# Patient Record
Sex: Male | Born: 2000 | Race: White | Hispanic: No | Marital: Single | Smoking: Never smoker
Health system: Southern US, Community
[De-identification: ages and names within clinical notes are randomized; demographics above are authoritative.]

## PROBLEM LIST (undated history)

## (undated) DIAGNOSIS — T7840XA Allergy, unspecified, initial encounter: Secondary | ICD-10-CM

## (undated) DIAGNOSIS — F909 Attention-deficit hyperactivity disorder, unspecified type: Secondary | ICD-10-CM

## (undated) DIAGNOSIS — F329 Major depressive disorder, single episode, unspecified: Secondary | ICD-10-CM

## (undated) DIAGNOSIS — F32A Depression, unspecified: Secondary | ICD-10-CM

## (undated) DIAGNOSIS — Z9109 Other allergy status, other than to drugs and biological substances: Secondary | ICD-10-CM

## (undated) DIAGNOSIS — F419 Anxiety disorder, unspecified: Secondary | ICD-10-CM

## (undated) DIAGNOSIS — K589 Irritable bowel syndrome without diarrhea: Secondary | ICD-10-CM

## (undated) DIAGNOSIS — R197 Diarrhea, unspecified: Secondary | ICD-10-CM

## (undated) HISTORY — DX: Irritable bowel syndrome, unspecified: K58.9

## (undated) HISTORY — DX: Diarrhea, unspecified: R19.7

## (undated) HISTORY — DX: Anxiety disorder, unspecified: F41.9

## (undated) HISTORY — DX: Allergy, unspecified, initial encounter: T78.40XA

## (undated) HISTORY — DX: Attention-deficit hyperactivity disorder, unspecified type: F90.9

---
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
## 2020-02-07 ENCOUNTER — Telehealth: Payer: Self-pay | Admitting: Psychiatry

## 2020-02-07 ENCOUNTER — Other Ambulatory Visit: Payer: Self-pay | Admitting: Psychiatry

## 2020-02-07 DIAGNOSIS — F902 Attention-deficit hyperactivity disorder, combined type: Secondary | ICD-10-CM

## 2020-02-07 DIAGNOSIS — G4724 Circadian rhythm sleep disorder, free running type: Secondary | ICD-10-CM

## 2020-02-07 MED ORDER — CLONIDINE HCL 0.3 MG PO TABS: ORAL_TABLET | ORAL | 0 refills | Status: DC

## 2020-02-07 NOTE — Telephone Encounter (Signed)
Dad called and LM at 4:16 that [facility] needs a refill of his clonidine.  Hasn't been seen since 08/2019.  Is now scheduled to see Almira Coaster 03/08/20 Send to [facility] on [address].Marland Kitchen

## 2020-03-08 ENCOUNTER — Encounter: Payer: Self-pay | Admitting: Adult Health

## 2020-03-08 ENCOUNTER — Telehealth: Payer: Commercial Managed Care - PPO | Admitting: Adult Health

## 2020-03-08 DIAGNOSIS — G4724 Circadian rhythm sleep disorder, free running type: Secondary | ICD-10-CM

## 2020-03-08 DIAGNOSIS — F902 Attention-deficit hyperactivity disorder, combined type: Secondary | ICD-10-CM

## 2020-03-08 DIAGNOSIS — F411 Generalized anxiety disorder: Secondary | ICD-10-CM

## 2020-03-08 DIAGNOSIS — F3342 Major depressive disorder, recurrent, in full remission: Secondary | ICD-10-CM

## 2020-03-08 NOTE — Progress Notes (Signed)
No show appointment  

## 2020-03-11 ENCOUNTER — Other Ambulatory Visit: Payer: Self-pay | Admitting: Adult Health

## 2020-03-11 ENCOUNTER — Telehealth: Payer: Self-pay | Admitting: Adult Health

## 2020-03-11 DIAGNOSIS — G4724 Circadian rhythm sleep disorder, free running type: Secondary | ICD-10-CM

## 2020-03-11 DIAGNOSIS — F902 Attention-deficit hyperactivity disorder, combined type: Secondary | ICD-10-CM

## 2020-03-11 MED ORDER — CLONIDINE HCL 0.3 MG PO TABS: ORAL_TABLET | ORAL | 0 refills | Status: DC

## 2020-03-11 NOTE — Telephone Encounter (Signed)
Next appt is 04/05/20. Requesting refill on Clonidine. Current Pharmacy is [facility] [address] [place], [place] [address]. Phone # is [phone].

## 2020-03-11 NOTE — Telephone Encounter (Signed)
Script sent  

## 2020-03-16 IMAGING — DX DG HAND COMPLETE 3+V*R*
3 series · 3 of 3 positions shown · non-contrast
Comparison: None.

CLINICAL DATA: Lacerations after punching a window.

EXAM:
RIGHT HAND - COMPLETE 3+ VIEW

[hand pa]
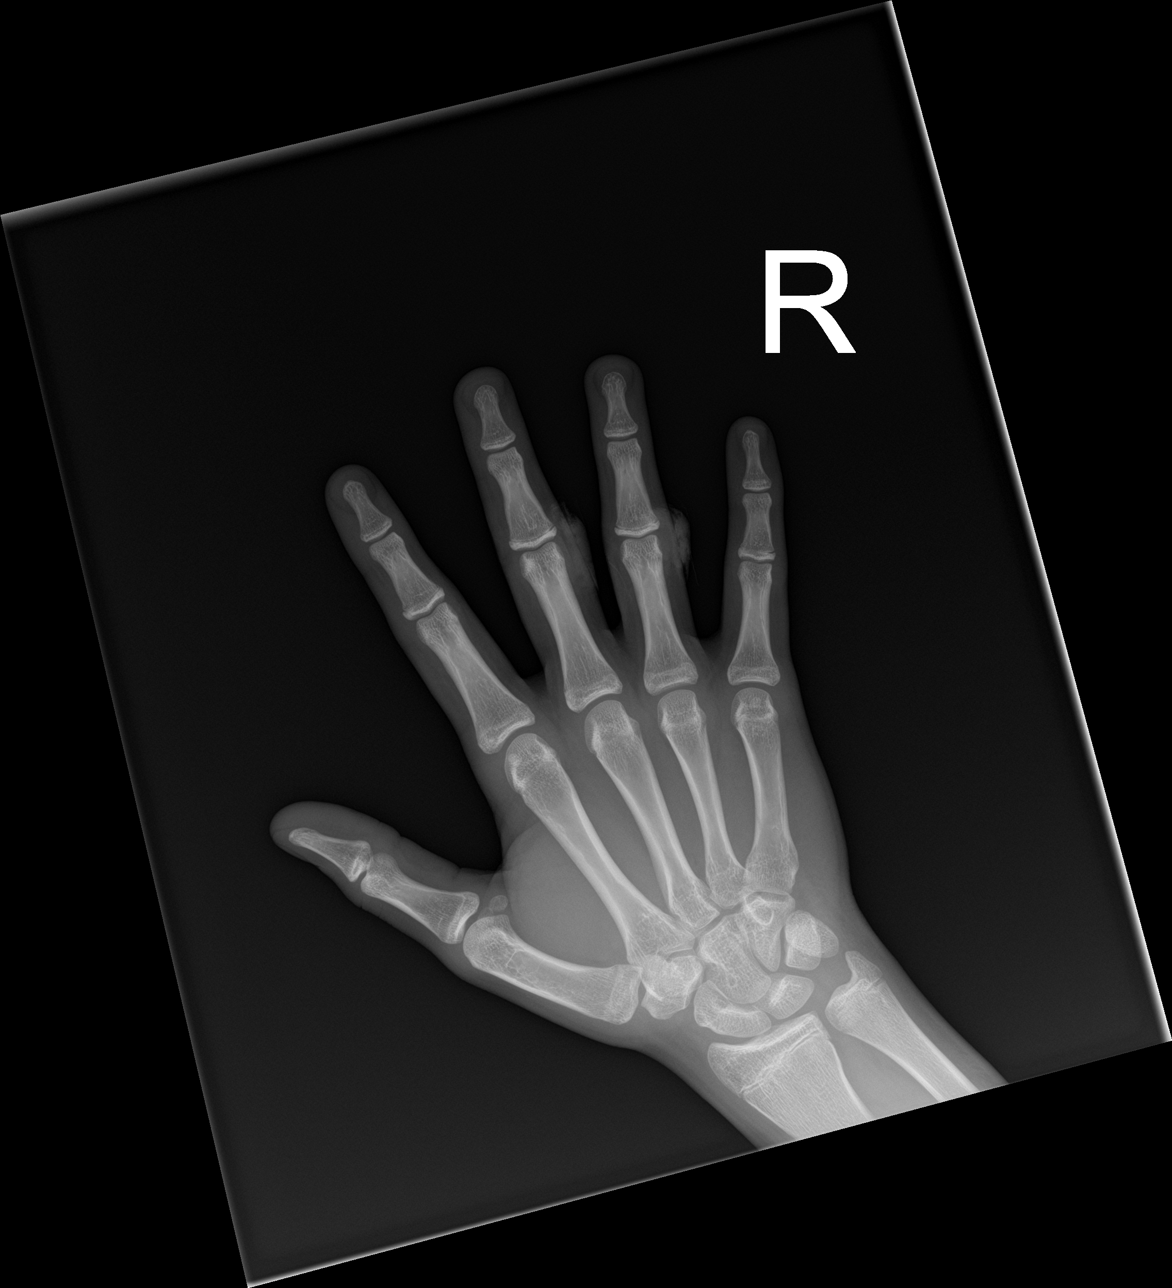

[hand obl]
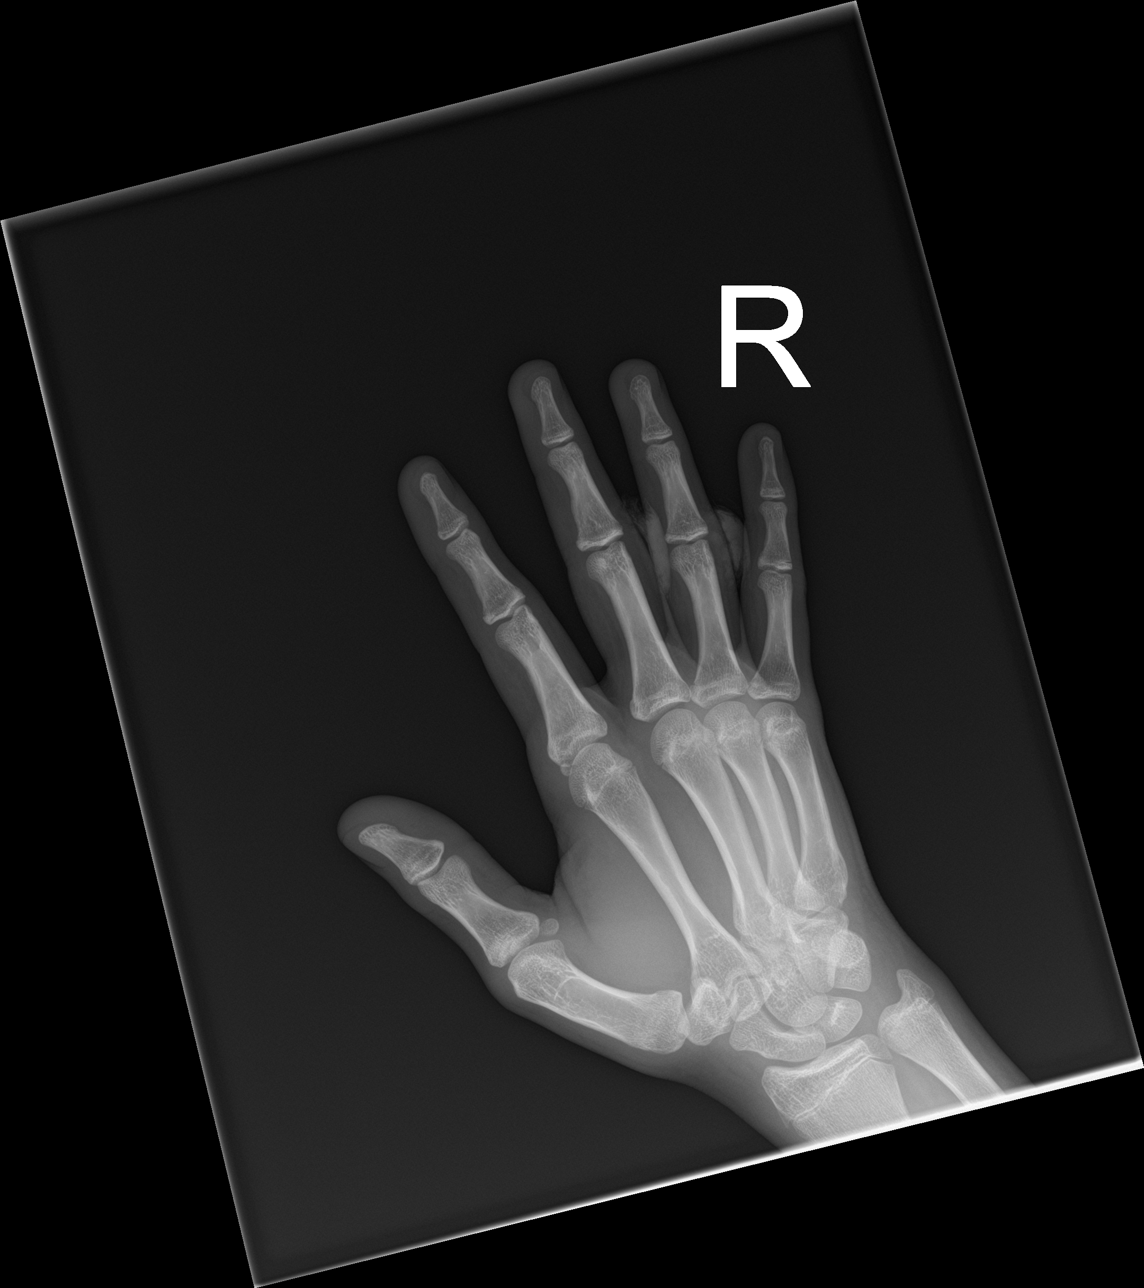

[hand lat]
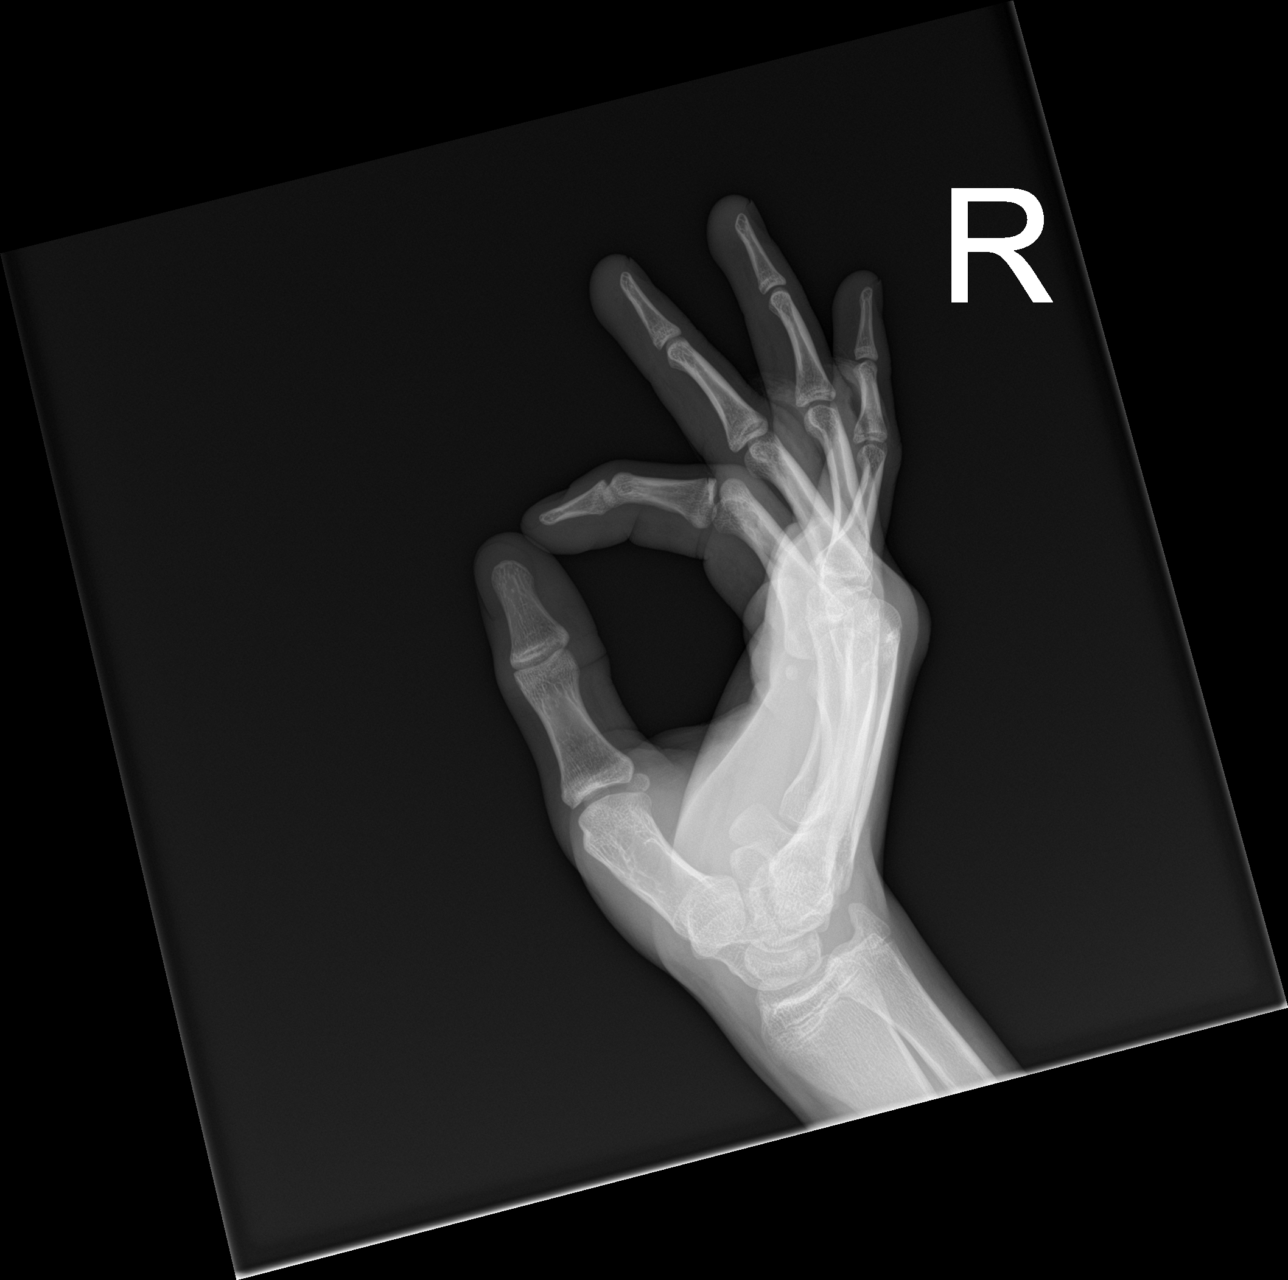

[3 of 3 positions shown; findings below may reference images not displayed]

FINDINGS: There is no fracture or dislocation or radiodense foreign body in or
on the soft tissues. Bandages around the PIP joints of the third and
fourth digits.
IMPRESSION: No acute osseous abnormality. No radiodense foreign body.

## 2020-04-05 ENCOUNTER — Telehealth (INDEPENDENT_AMBULATORY_CARE_PROVIDER_SITE_OTHER): Payer: Commercial Managed Care - PPO | Admitting: Adult Health

## 2020-04-05 ENCOUNTER — Encounter: Payer: Self-pay | Admitting: Adult Health

## 2020-04-05 DIAGNOSIS — G4724 Circadian rhythm sleep disorder, free running type: Secondary | ICD-10-CM | POA: Diagnosis not present

## 2020-04-05 DIAGNOSIS — F902 Attention-deficit hyperactivity disorder, combined type: Secondary | ICD-10-CM | POA: Diagnosis not present

## 2020-04-05 DIAGNOSIS — F411 Generalized anxiety disorder: Secondary | ICD-10-CM

## 2020-04-05 MED ORDER — DEXTROAMPHETAMINE SULFATE ER 10 MG PO CP24: 10.0000 mg | ORAL_CAPSULE | Freq: Every day | ORAL | 0 refills | Status: DC

## 2020-04-05 MED ORDER — CLONIDINE HCL 0.3 MG PO TABS
ORAL_TABLET | ORAL | 5 refills | Status: DC
Start: 2020-04-05 — End: 2020-10-03

## 2020-04-05 NOTE — Progress Notes (Deleted)
[facility]. [id number] 01-24-2001 20 y.o.  Subjective:   Patient ID:  [facility]. is a 20 y.o. (DOB 06-21-2000) male.  Chief Complaint: No chief complaint on file.   HPI [facility]. presents to the office today for follow-up of ADHD, GAD, circadian sleep disorder  Describes mood today as "ok". Pleasant. Mood symptoms - denies depression, anxiety, and irritability. Stating "I'm doing really well, the best I have in a few years". Reporting some struggles with focus and concentration. Has not tolerated Ritalin, Adderall, or Vyvanse. Would like to do a trial of Dexedrine and see if that would be helpful. Has researched side effects and hopeful he may some benefit. Concerned about increased heart rate with Adderall XR even though it was helpful. Stable interest and motivation. Taking medications as prescribed.  - Energy levels stable. Active, does not have a regular exercise routine.  Enjoys some usual interests and activities. Student. Lives with parents in [facility] [employer] - has a brother and sister. Spending time with family and friends Appetite adequate. Weight stable - 145 pounds - 5'10".  Sleeps well most nights. Averages 8 hours. Focus and concentration stable. Completing tasks. Managing aspects of household. Physicist, medical at [employer].  Denies SI or HI.  Denies AH or VH.  Previous medication trials: Vyvanse, Adderall XR   Flowsheet Row ED from 07/29/2018 in [facility] ED from 06/30/2018 in [facility]  C-SSRS RISK CATEGORY No Risk No Risk       Review of Systems:  Review of Systems  Musculoskeletal: Negative for gait problem.  Neurological: Negative for tremors.  Psychiatric/Behavioral:       Please refer to HPI    Medications: I have reviewed the patient's current medications.  Current Outpatient Medications  Medication Sig Dispense Refill  .  dextroamphetamine (DEXEDRINE SPANSULE) 10 MG 24 hr capsule Take 1 capsule (10 mg total) by mouth daily. 30 capsule 0  . cloNIDine (CATAPRES) 0.3 MG tablet TAKE 1 TABLET(0.3 MG) BY MOUTH AT BEDTIME 30 tablet 5  . mirtazapine (REMERON) 15 MG tablet Take 1 tablet (15 mg total) by mouth at bedtime. 30 tablet 3   No current facility-administered medications for this visit.    Medication Side Effects: None  Allergies:  Allergies  Allergen Reactions  . Pertussis Vaccines Other (See Comments)    Fever, dialated pupils, swelling, non-stop crying    Past Medical History:  Diagnosis Date  . ADHD (attention deficit hyperactivity disorder)   . Allergy    Seasonal  . Anxiety    Social, tests, flying, sleep  . Depression   . Diarrhea    Crampy  . Environmental allergies   . Irritable bowel syndrome    per parent report    Family History  Problem Relation Age of Onset  . Brain cancer Maternal Grandmother   . Anxiety disorder Maternal Grandmother   . Drug abuse Maternal Grandmother   . Alcohol abuse Maternal Grandmother   . Hypertension Mother   . Anxiety disorder Mother   . Depression Mother   . Brain cancer Paternal Grandmother   . Drug abuse Paternal Grandmother   . Alcohol abuse Paternal Grandmother   . Aortic stenosis Paternal Grandfather   . Alcohol abuse Paternal Grandfather   . Drug abuse Paternal Grandfather   . ADD / ADHD Father   . Drug abuse Father   . Migraines Neg Hx   . Seizures Neg Hx   .  Autism Neg Hx   . Bipolar disorder Neg Hx   . Schizophrenia Neg Hx     Social History   Socioeconomic History  . Marital status: Single    Spouse name: Not on file  . Number of children: Not on file  . Years of education: Not on file  . Highest education level: 11th grade  Occupational History  . Occupation: Student    Comment: [facility]  Tobacco Use  . Smoking status: Never Smoker  . Smokeless tobacco: Never Used  Vaping Use  . Vaping Use: Never used  Substance and  Sexual Activity  . Alcohol use: Not Currently    Alcohol/week: 0.0 standard drinks  . Drug use: Not Currently    Types: Marijuana, Benzodiazepines  . Sexual activity: Never  Other Topics Concern  . Not on file  Social History Narrative   Lives with mom, mom has recently moved out. He will be in the 12th grade when he returns to school at [employer].  He missed many initial weeks of 11th grade being at [employer] in the end of the year being panic and disruption ultimately needing [facility], though [facility] still passed him to the 12th grade with expectations for content of achievement.  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  He identifies anxiety and depression with maternal side of the family.  They consider that ADHD was evident including with hyperactivity in early elementary.  [facility] with [facility], NP and staff treated ADHD followed by testing by [facility] Dames, PhD with diagnosis of generalized anxiety disorder at [facility] then possibly others.  Diagnoses continue to expand and complicate treatment with few medications hoping in many making him worse.  [facility] nurse [facility] helped the family a great deal and refers patient to pursue care with a personal psychiatrist rather than team of ancillary staff as patient's illness anxiety continues to cooperate more diagnoses and treatment options that ultimately dilute his time rather than benefiting his skills, understanding, or symptoms.   Social Determinants of Health   Financial Resource Strain: Not on file  Food Insecurity: Not on file  Transportation Needs: Not on file  Physical Activity: Not on file  Stress: Not on file  Social Connections: Not on file  Intimate Partner Violence: Not on file    Past Medical History, Surgical history, Social history, and Family history were reviewed and updated as appropriate.   Please see review of  systems for further details on the patient's review from today.   Objective:   Physical Exam:  There were no vitals taken for this visit.  Physical Exam Constitutional:      General: He is not in acute distress. Musculoskeletal:        General: No deformity.  Neurological:     Mental Status: He is alert and oriented to person, place, and time.     Coordination: Coordination normal.  Psychiatric:        Attention and Perception: Attention and perception normal. He does not perceive auditory or visual hallucinations.        Mood and Affect: Mood normal. Mood is not anxious or depressed. Affect is not labile, blunt, angry or inappropriate.        Speech: Speech normal.        Behavior: Behavior normal.        Thought Content: Thought content normal. Thought content is not paranoid or delusional. Thought content does not  include homicidal or suicidal ideation. Thought content does not include homicidal or suicidal plan.        Cognition and Memory: Cognition and memory normal.        Judgment: Judgment normal.     Comments: Insight intact     Lab Review:     Component Value Date/Time   NA 139 06/30/2018 0043   K 3.7 06/30/2018 0043   CL 103 06/30/2018 0043   CO2 23 06/30/2018 0043   GLUCOSE 92 06/30/2018 0043   BUN 10 06/30/2018 0043   CREATININE 0.77 06/30/2018 0043   CALCIUM 9.9 06/30/2018 0043   PROT 7.0 06/30/2018 0043   ALBUMIN 4.7 06/30/2018 0043   AST 35 06/30/2018 0043   ALT 37 06/30/2018 0043   ALKPHOS 109 06/30/2018 0043   BILITOT 0.9 06/30/2018 0043   GFRNONAA NOT CALCULATED 06/30/2018 0043   GFRAA NOT CALCULATED 06/30/2018 0043       Component Value Date/Time   WBC 13.2 06/30/2018 0043   RBC 5.83 (H) 06/30/2018 0043   HGB 17.1 (H) 06/30/2018 0043   HCT 48.3 06/30/2018 0043   PLT 291 06/30/2018 0043   MCV 82.8 06/30/2018 0043   MCH 29.3 06/30/2018 0043   MCHC 35.4 06/30/2018 0043   RDW 13.4 06/30/2018 0043   LYMPHSABS 4.4 06/30/2018 0043   MONOABS  1.0 06/30/2018 0043   EOSABS 0.1 06/30/2018 0043   BASOSABS 0.0 06/30/2018 0043    No results found for: POCLITH, LITHIUM   No results found for: PHENYTOIN, PHENOBARB, VALPROATE, CBMZ   .res Assessment: Plan:    Plan:  PDMP reviewed  1. Clonidine 0.3mg   2. Add Dexidrine 10mg  daily  RTC 4 weeks  Patient advised to contact office with any questions, adverse effects, or acute worsening in signs and symptoms.  Discussed potential benefits, risks, and side effects of stimulants with patient to include increased heart rate, palpitations, insomnia, increased anxiety, increased irritability, or decreased appetite.  Instructed patient to contact office if experiencing any significant tolerability issues.  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     Please see After Visit Summary for patient specific instructions.  No future appointments.  No orders of the defined types were placed in this encounter.   -------------------------------

## 2020-04-08 NOTE — Addendum Note (Signed)
Addended by: Dorothyann Gibbs on: 04/08/2020 03:10 PM   Modules accepted: Level of Service

## 2020-04-08 NOTE — Progress Notes (Signed)
William Love [id number] October 23, 2000 20 y.o.  Virtual Visit via Telephone Note  I connected with pt on 04/08/20 at  1:00 PM EST by telephone and verified that I am speaking with the correct person using two identifiers.   I discussed the limitations, risks, security and privacy concerns of performing an evaluation and management service by telephone and the availability of in person appointments. I also discussed with the patient that there may be a patient responsible charge related to this service. The patient expressed understanding and agreed to proceed.   I discussed the assessment and treatment plan with the patient. The patient was provided an opportunity to ask questions and all were answered. The patient agreed with the plan and demonstrated an understanding of the instructions.   The patient was advised to call back or seek an in-person evaluation if the symptoms worsen or if the condition fails to improve as anticipated.  I provided 20 minutes of non-face-to-face time during this encounter.  The patient was located at home.  The provider was located at [facility].   Dorothyann Gibbs, NP   Subjective:   Patient ID:  William Love. is a 20 y.o. (DOB [birth year]) male.  Chief Complaint: No chief complaint on file.   HPI William Love. presents for follow-up of ADHD, GAD, circadian sleep disorder  Describes mood today as "ok". Pleasant. Mood symptoms - denies depression, anxiety, and irritability. Stating "I'm doing really well, the best I have in a few years". Reporting some struggles with focus and concentration. Has not tolerated Ritalin, Adderall, or Vyvanse. Would like to do a trial of Dexedrine and see if that would be helpful. Has researched side effects and hopeful he may some benefit. Concerned about increased heart rate with Adderall XR even though it was helpful. Stable interest and motivation. Taking medications as  prescribed.  - Energy levels stable. Active, does not have a regular exercise routine.  Enjoys some usual interests and activities. Student. Lives with parents in [place] - has a brother and sister. Spending time with family and friends Appetite adequate. Weight stable - 145 pounds - 5'10".  Sleeps well most nights. Averages 8 hours. Focus and concentration stable. Completing tasks. Managing aspects of household. 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  Denies AH or VH.  Previous medication trials: Vyvanse, Adderall XR      Review of Systems:  Review of Systems  Musculoskeletal: Negative for gait problem.  Neurological: Negative for tremors.  Psychiatric/Behavioral:       Please refer to HPI    Medications: I have reviewed the patient's current medications.  Current Outpatient Medications  Medication Sig Dispense Refill  . dextroamphetamine (DEXEDRINE SPANSULE) 10 MG 24 hr capsule Take 1 capsule (10 mg total) by mouth daily. 30 capsule 0  . cloNIDine (CATAPRES) 0.3 MG tablet TAKE 1 TABLET(0.3 MG) BY MOUTH AT BEDTIME 30 tablet 5  . mirtazapine (REMERON) 15 MG tablet Take 1 tablet (15 mg total) by mouth at bedtime. 30 tablet 3   No current facility-administered medications for this visit.    Medication Side Effects: Headache  Allergies:  Allergies  Allergen Reactions  . Pertussis Vaccines Other (See Comments)    Fever, dialated pupils, swelling, non-stop crying    Past Medical History:  Diagnosis Date  . ADHD (attention deficit hyperactivity disorder)   . Allergy    Seasonal  . Anxiety    Social, tests, flying, sleep  .  Depression   . Diarrhea    Crampy  . Environmental allergies   . Irritable bowel syndrome    per parent report    Family History  Problem Relation Age of Onset  . Brain cancer Maternal Grandmother   . Anxiety disorder Maternal Grandmother   . Drug abuse Maternal Grandmother   . Alcohol abuse Maternal Grandmother   .  Hypertension Mother   . Anxiety disorder Mother   . Depression Mother   . Brain cancer Paternal Grandmother   . Drug abuse Paternal Grandmother   . Alcohol abuse Paternal Grandmother   . Aortic stenosis Paternal Grandfather   . Alcohol abuse Paternal Grandfather   . Drug abuse Paternal Grandfather   . ADD / ADHD Father   . Drug abuse Father   . Migraines Neg Hx   . Seizures Neg Hx   . Autism Neg Hx   . Bipolar disorder Neg Hx   . Schizophrenia Neg Hx     Social History   Socioeconomic History  . Marital status: Single    Spouse name: Not on file  . Number of children: Not on file  . Years of education: Not on file  . Highest education level: 11th grade  Occupational History  . Occupation: Student    Comment: [employer]  Tobacco Use  . Smoking status: Never Smoker  . Smokeless tobacco: Never Used  Vaping Use  . Vaping Use: Never used  Substance and Sexual Activity  . Alcohol use: Not Currently    Alcohol/week: 0.0 standard drinks  . Drug use: Not Currently    Types: Marijuana, Benzodiazepines  . Sexual activity: Never  Other Topics Concern  . Not on file  Social History Narrative   Lives with mom, mom has recently moved out. He will be in the 12th grade when he returns to school at [employer].  He missed many initial weeks of 11th grade being at [employer] in the end of the year being panic and disruption ultimately needing [employer] [facility] [employer], though [employer] still passed him to the 12th grade with expectations for content of achievement.  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  He identifies anxiety and depression with maternal side of the family.  They consider that ADHD was evident including with hyperactivity in early elementary.  [facility] with Eula Flax, NP and staff treated ADHD followed by testing by Bryson Dames, PhD with diagnosis of generalized anxiety disorder at [facility] then possibly  others.  Diagnoses continue to expand and complicate treatment with few medications hoping in many making him worse.  Hospital nurse Vicenta Dunning helped the family a great deal and refers patient to pursue care with a personal psychiatrist rather than team of ancillary staff as patient's illness anxiety continues to cooperate more diagnoses and treatment options that ultimately dilute his time rather than benefiting his skills, understanding, or symptoms.   Social Determinants of Health   Financial Resource Strain: Not on file  Food Insecurity: Not on file  Transportation Needs: Not on file  Physical Activity: Not on file  Stress: Not on file  Social Connections: Not on file  Intimate Partner Violence: Not on file    Past Medical History, Surgical history, Social history, and Family history were reviewed and updated as appropriate.   Please see review of systems for further details on the patient's review from today.   Objective:   Physical Exam:  There were no vitals  taken for this visit.  Physical Exam Constitutional:      General: He is not in acute distress. Musculoskeletal:        General: No deformity.  Neurological:     Mental Status: He is alert and oriented to person, place, and time.     Coordination: Coordination normal.  Psychiatric:        Attention and Perception: Attention and perception normal. He does not perceive auditory or visual hallucinations.        Mood and Affect: Mood normal. Mood is not anxious or depressed. Affect is not labile, blunt, angry or inappropriate.        Speech: Speech normal.        Behavior: Behavior normal.        Thought Content: Thought content normal. Thought content is not paranoid or delusional. Thought content does not include homicidal or suicidal ideation. Thought content does not include homicidal or suicidal plan.        Cognition and Memory: Cognition and memory normal.        Judgment: Judgment normal.     Comments:  Insight intact     Lab Review:     Component Value Date/Time   NA 139 06/30/2018 0043   K 3.7 06/30/2018 0043   CL 103 06/30/2018 0043   CO2 23 06/30/2018 0043   GLUCOSE 92 06/30/2018 0043   BUN 10 06/30/2018 0043   CREATININE 0.77 06/30/2018 0043   CALCIUM 9.9 06/30/2018 0043   PROT 7.0 06/30/2018 0043   ALBUMIN 4.7 06/30/2018 0043   AST 35 06/30/2018 0043   ALT 37 06/30/2018 0043   ALKPHOS 109 06/30/2018 0043   BILITOT 0.9 06/30/2018 0043   GFRNONAA NOT CALCULATED 06/30/2018 0043   GFRAA NOT CALCULATED 06/30/2018 0043       Component Value Date/Time   WBC 13.2 06/30/2018 0043   RBC 5.83 (H) 06/30/2018 0043   HGB 17.1 (H) 06/30/2018 0043   HCT 48.3 06/30/2018 0043   PLT 291 06/30/2018 0043   MCV 82.8 06/30/2018 0043   MCH 29.3 06/30/2018 0043   MCHC 35.4 06/30/2018 0043   RDW 13.4 06/30/2018 0043   LYMPHSABS 4.4 06/30/2018 0043   MONOABS 1.0 06/30/2018 0043   EOSABS 0.1 06/30/2018 0043   BASOSABS 0.0 06/30/2018 0043    No results found for: POCLITH, LITHIUM   No results found for: PHENYTOIN, PHENOBARB, VALPROATE, CBMZ   .res Assessment: Plan:    Plan:  PDMP reviewed  1. Clonidine 0.3mg   2. Add Dexidrine 10mg  daily  RTC 4 weeks  Patient advised to contact office with any questions, adverse effects, or acute worsening in signs and symptoms.  Discussed potential benefits, risks, and side effects of stimulants with patient to include increased heart rate, palpitations, insomnia, increased anxiety, increased irritability, or decreased appetite.  Instructed patient to contact office if experiencing any significant tolerability issues.  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    Please see After Visit Summary for patient  specific instructions.  No future appointments.  No orders of the defined types were placed in this encounter.     -------------------------------

## 2020-10-03 ENCOUNTER — Encounter: Payer: Self-pay | Admitting: Adult Health

## 2020-10-03 ENCOUNTER — Telehealth (INDEPENDENT_AMBULATORY_CARE_PROVIDER_SITE_OTHER): Payer: Commercial Managed Care - PPO | Admitting: Adult Health

## 2020-10-03 DIAGNOSIS — F411 Generalized anxiety disorder: Secondary | ICD-10-CM

## 2020-10-03 DIAGNOSIS — F902 Attention-deficit hyperactivity disorder, combined type: Secondary | ICD-10-CM

## 2020-10-03 DIAGNOSIS — G4724 Circadian rhythm sleep disorder, free running type: Secondary | ICD-10-CM

## 2020-10-03 MED ORDER — DEXTROAMPHETAMINE SULFATE ER 10 MG PO CP24
10.0000 mg | ORAL_CAPSULE | Freq: Every day | ORAL | 0 refills | Status: AC
Start: 2020-10-03 — End: ?

## 2020-10-03 NOTE — Progress Notes (Signed)
William Love [id number] February 22, 2000 20 y.o.  Virtual Visit via Video Note  I connected with pt @ on 10/03/20 at 10:40 AM EDT by a video enabled telemedicine application and verified that I am speaking with the correct person using two identifiers.   I discussed the limitations of evaluation and management by telemedicine and the availability of in person appointments. The patient expressed understanding and agreed to proceed.  I discussed the assessment and treatment plan with the patient. The patient was provided an opportunity to ask questions and all were answered. The patient agreed with the plan and demonstrated an understanding of the instructions.   The patient was advised to call back or seek an in-person evaluation if the symptoms worsen or if the condition fails to improve as anticipated.  I provided 15 minutes of non-face-to-face time during this encounter.  The patient was located at home.  The provider was located at [facility].   Dorothyann Gibbs, NP   Subjective:   Patient ID:  William Wadding. is a 20 y.o. (DOB 06-Jul-2000) male.  Chief Complaint: No chief complaint on file.   HPI William Love. presents for follow-up of ADHD, GAD, circadian sleep disorder  Describes mood today as "ok". Pleasant. Mood symptoms - denies depression, anxiety, and irritability. Stating "I'm doing great". Had a productive summer. Has returned to [employer]. Lives with a roommate. Feels like the Dexedrine works well for him. Stating "I have felt much better with it". Plans to start taking every day. Stable interest and motivation. Taking medications as prescribed.   Energy levels stable. Active, does not have a regular exercise routine.  Enjoys some usual interests and activities. Student. Lives with parents in [place] - has a brother and sister. Spending time with family and friends Appetite adequate - trying to eat more.. Weight  stable - 140 - 145 pounds - 5'10".  Sleeps well most nights. Practicing better "sleep hygiene". Averages 8 hours. Focus and concentration stable. Completing tasks. Managing aspects of household. 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  Denies AH or VH.  Previous medication trials: Vyvanse, Adderall XR  Review of Systems:  Review of Systems  Musculoskeletal:  Negative for gait problem.  Neurological:  Negative for tremors.  Psychiatric/Behavioral:         Please refer to HPI   Medications: I have reviewed the patient's current medications.  Current Outpatient Medications  Medication Sig Dispense Refill   dextroamphetamine (DEXEDRINE SPANSULE) 10 MG 24 hr capsule Take 1 capsule (10 mg total) by mouth daily. 30 capsule 0   No current facility-administered medications for this visit.    Medication Side Effects: None  Allergies:  Allergies  Allergen Reactions   Pertussis Vaccines Other (See Comments)    Fever, dialated pupils, swelling, non-stop crying    Past Medical History:  Diagnosis Date   ADHD (attention deficit hyperactivity disorder)    Allergy    Seasonal   Anxiety    Social, tests, flying, sleep   Depression    Diarrhea    Crampy   Environmental allergies    Irritable bowel syndrome    per parent report    Family History  Problem Relation Age of Onset   Brain cancer Maternal Grandmother    Anxiety disorder Maternal Grandmother    Drug abuse Maternal Grandmother    Alcohol abuse Maternal Grandmother    Hypertension Mother    Anxiety disorder Mother  Depression Mother    Brain cancer Paternal Grandmother    Drug abuse Paternal Grandmother    Alcohol abuse Paternal Grandmother    Aortic stenosis Paternal Grandfather    Alcohol abuse Paternal Grandfather    Drug abuse Paternal Grandfather    ADD / ADHD Father    Drug abuse Father    Migraines Neg Hx    Seizures Neg Hx    Autism Neg Hx    Bipolar disorder Neg Hx    Schizophrenia Neg  Hx     Social History   Socioeconomic History   Marital status: Single    Spouse name: Not on file   Number of children: Not on file   Years of education: Not on file   Highest education level: 11th grade  Occupational History   Occupation: Consulting civil engineer    Comment: [employer]  Tobacco Use   Smoking status: Never   Smokeless tobacco: Never  Vaping Use   Vaping Use: Never used  Substance and Sexual Activity   Alcohol use: Not Currently    Alcohol/week: 0.0 standard drinks   Drug use: Not Currently    Types: Marijuana, Benzodiazepines   Sexual activity: Never  Other Topics Concern   Not on file  Social History Narrative   Lives with mom, mom has recently moved out. He will be in the 12th grade when he returns to school at [employer].  He missed many initial weeks of 11th grade being at [employer] in the end of the year being panic and disruption ultimately needing [employer], though [employer] still passed him to the 12th grade with expectations for content of achievement.  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  He identifies anxiety and depression with maternal side of the family.  They consider that ADHD was evident including with hyperactivity in early elementary.  [facility] with Eula Flax, NP and staff treated ADHD followed by testing by Bryson Dames, PhD with diagnosis of generalized anxiety disorder at [facility] then possibly others.  Diagnoses continue to expand and complicate treatment with few medications hoping in many making him worse.  Hospital nurse Vicenta Dunning helped the family a great deal and refers patient to pursue care with a personal psychiatrist rather than team of ancillary staff as patient's illness anxiety continues to cooperate more diagnoses and treatment options that ultimately dilute his time rather than benefiting his skills, understanding, or symptoms.   Social Determinants of  Health   Financial Resource Strain: Not on file  Food Insecurity: Not on file  Transportation Needs: Not on file  Physical Activity: Not on file  Stress: Not on file  Social Connections: Not on file  Intimate Partner Violence: Not on file    Past Medical History, Surgical history, Social history, and Family history were reviewed and updated as appropriate.   Please see review of systems for further details on the patient's review from today.   Objective:   Physical Exam:  There were no vitals taken for this visit.  Physical Exam Constitutional:      General: He is not in acute distress. Musculoskeletal:        General: No deformity.  Neurological:     Mental Status: He is alert and oriented to person, place, and time.     Coordination: Coordination normal.  Psychiatric:        Attention and Perception: Attention and perception normal. He does not perceive auditory or visual  hallucinations.        Mood and Affect: Mood normal. Mood is not anxious or depressed. Affect is not labile, blunt, angry or inappropriate.        Speech: Speech normal.        Behavior: Behavior normal.        Thought Content: Thought content normal. Thought content is not paranoid or delusional. Thought content does not include homicidal or suicidal ideation. Thought content does not include homicidal or suicidal plan.        Cognition and Memory: Cognition and memory normal.        Judgment: Judgment normal.     Comments: Insight intact    Lab Review:     Component Value Date/Time   NA 139 06/30/2018 0043   K 3.7 06/30/2018 0043   CL 103 06/30/2018 0043   CO2 23 06/30/2018 0043   GLUCOSE 92 06/30/2018 0043   BUN 10 06/30/2018 0043   CREATININE 0.77 06/30/2018 0043   CALCIUM 9.9 06/30/2018 0043   PROT 7.0 06/30/2018 0043   ALBUMIN 4.7 06/30/2018 0043   AST 35 06/30/2018 0043   ALT 37 06/30/2018 0043   ALKPHOS 109 06/30/2018 0043   BILITOT 0.9 06/30/2018 0043   GFRNONAA NOT CALCULATED  06/30/2018 0043   GFRAA NOT CALCULATED 06/30/2018 0043       Component Value Date/Time   WBC 13.2 06/30/2018 0043   RBC 5.83 (H) 06/30/2018 0043   HGB 17.1 (H) 06/30/2018 0043   HCT 48.3 06/30/2018 0043   PLT 291 06/30/2018 0043   MCV 82.8 06/30/2018 0043   MCH 29.3 06/30/2018 0043   MCHC 35.4 06/30/2018 0043   RDW 13.4 06/30/2018 0043   LYMPHSABS 4.4 06/30/2018 0043   MONOABS 1.0 06/30/2018 0043   EOSABS 0.1 06/30/2018 0043   BASOSABS 0.0 06/30/2018 0043    No results found for: POCLITH, LITHIUM   No results found for: PHENYTOIN, PHENOBARB, VALPROATE, CBMZ   .res Assessment: Plan:     Plan:  PDMP reviewed  1. D/C Clonidine 0.3mg   2. Dexidrine 10mg  daily  RTC 4 weeks  Patient advised to contact office with any questions, adverse effects, or acute worsening in signs and symptoms.  Discussed potential benefits, risks, and side effects of stimulants with patient to include increased heart rate, palpitations, insomnia, increased anxiety, increased irritability, or decreased appetite.  Instructed patient to contact office if experiencing any significant tolerability issues.  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  Generalized anxiety disorder    Please see After Visit Summary for patient specific instructions.  No future appointments.  No orders of the defined types were placed in this encounter.     -------------------------------
